# Patient Record
Sex: Female | Born: 1960 | Race: White | Hispanic: No | Marital: Married | Smoking: Never smoker
Health system: Southern US, Community
[De-identification: ages and names within clinical notes are randomized; demographics above are authoritative.]

## PROBLEM LIST (undated history)

## (undated) DIAGNOSIS — E785 Hyperlipidemia, unspecified: Secondary | ICD-10-CM

## (undated) DIAGNOSIS — I10 Essential (primary) hypertension: Secondary | ICD-10-CM

## (undated) HISTORY — DX: Essential (primary) hypertension: I10

## (undated) HISTORY — DX: Hyperlipidemia, unspecified: E78.5

---

## 1998-05-03 ENCOUNTER — Ambulatory Visit (HOSPITAL_COMMUNITY): Admission: RE | Admit: 1998-05-03 | Discharge: 1998-05-03 | Payer: Self-pay | Admitting: Family Medicine

## 2000-09-19 ENCOUNTER — Encounter: Payer: Self-pay | Admitting: Family Medicine

## 2000-09-19 ENCOUNTER — Ambulatory Visit (HOSPITAL_COMMUNITY): Admission: RE | Admit: 2000-09-19 | Discharge: 2000-09-19 | Payer: Self-pay | Admitting: Family Medicine

## 2001-10-28 ENCOUNTER — Other Ambulatory Visit: Admission: RE | Admit: 2001-10-28 | Discharge: 2001-10-28 | Payer: Self-pay | Admitting: Family Medicine

## 2003-06-17 ENCOUNTER — Encounter: Payer: Self-pay | Admitting: Family Medicine

## 2003-06-17 ENCOUNTER — Ambulatory Visit (HOSPITAL_COMMUNITY): Admission: RE | Admit: 2003-06-17 | Discharge: 2003-06-17 | Payer: Self-pay | Admitting: Family Medicine

## 2004-07-25 ENCOUNTER — Other Ambulatory Visit: Admission: RE | Admit: 2004-07-25 | Discharge: 2004-07-25 | Payer: Self-pay | Admitting: Family Medicine

## 2004-08-17 ENCOUNTER — Ambulatory Visit (HOSPITAL_COMMUNITY): Admission: RE | Admit: 2004-08-17 | Discharge: 2004-08-17 | Payer: Self-pay | Admitting: Family Medicine

## 2005-09-11 ENCOUNTER — Other Ambulatory Visit: Admission: RE | Admit: 2005-09-11 | Discharge: 2005-09-11 | Payer: Self-pay | Admitting: Family Medicine

## 2005-10-25 ENCOUNTER — Ambulatory Visit (HOSPITAL_COMMUNITY): Admission: RE | Admit: 2005-10-25 | Discharge: 2005-10-25 | Payer: Self-pay | Admitting: Family Medicine

## 2006-11-26 ENCOUNTER — Other Ambulatory Visit: Admission: RE | Admit: 2006-11-26 | Discharge: 2006-11-26 | Payer: Self-pay | Admitting: Family Medicine

## 2006-11-30 ENCOUNTER — Ambulatory Visit (HOSPITAL_COMMUNITY): Admission: RE | Admit: 2006-11-30 | Discharge: 2006-11-30 | Payer: Self-pay | Admitting: Family Medicine

## 2007-12-11 ENCOUNTER — Other Ambulatory Visit: Admission: RE | Admit: 2007-12-11 | Discharge: 2007-12-11 | Payer: Self-pay | Admitting: Family Medicine

## 2007-12-31 ENCOUNTER — Ambulatory Visit (HOSPITAL_COMMUNITY): Admission: RE | Admit: 2007-12-31 | Discharge: 2007-12-31 | Payer: Self-pay | Admitting: Family Medicine

## 2008-01-10 ENCOUNTER — Encounter: Admission: RE | Admit: 2008-01-10 | Discharge: 2008-01-10 | Payer: Self-pay | Admitting: Family Medicine

## 2008-02-06 ENCOUNTER — Encounter (INDEPENDENT_AMBULATORY_CARE_PROVIDER_SITE_OTHER): Payer: Self-pay | Admitting: Interventional Radiology

## 2008-02-06 ENCOUNTER — Encounter: Admission: RE | Admit: 2008-02-06 | Discharge: 2008-02-06 | Payer: Self-pay | Admitting: Surgery

## 2008-02-06 ENCOUNTER — Other Ambulatory Visit: Admission: RE | Admit: 2008-02-06 | Discharge: 2008-02-06 | Payer: Self-pay | Admitting: Interventional Radiology

## 2008-12-23 ENCOUNTER — Other Ambulatory Visit: Admission: RE | Admit: 2008-12-23 | Discharge: 2008-12-23 | Payer: Self-pay | Admitting: Family Medicine

## 2009-01-08 ENCOUNTER — Encounter: Admission: RE | Admit: 2009-01-08 | Discharge: 2009-01-08 | Payer: Self-pay | Admitting: Family Medicine

## 2009-01-12 ENCOUNTER — Encounter: Admission: RE | Admit: 2009-01-12 | Discharge: 2009-01-12 | Payer: Self-pay | Admitting: Family Medicine

## 2009-12-27 ENCOUNTER — Other Ambulatory Visit: Admission: RE | Admit: 2009-12-27 | Discharge: 2009-12-27 | Payer: Self-pay | Admitting: Family Medicine

## 2010-02-16 ENCOUNTER — Encounter: Admission: RE | Admit: 2010-02-16 | Discharge: 2010-02-16 | Payer: Self-pay | Admitting: Family Medicine

## 2010-02-22 ENCOUNTER — Encounter: Admission: RE | Admit: 2010-02-22 | Discharge: 2010-02-22 | Payer: Self-pay | Admitting: Family Medicine

## 2010-09-25 ENCOUNTER — Encounter: Payer: Self-pay | Admitting: Family Medicine

## 2010-09-26 ENCOUNTER — Encounter: Payer: Self-pay | Admitting: Family Medicine

## 2011-02-07 ENCOUNTER — Encounter (INDEPENDENT_AMBULATORY_CARE_PROVIDER_SITE_OTHER): Payer: Self-pay | Admitting: Surgery

## 2011-02-23 ENCOUNTER — Other Ambulatory Visit: Payer: Self-pay | Admitting: Obstetrics and Gynecology

## 2011-02-23 DIAGNOSIS — Z1231 Encounter for screening mammogram for malignant neoplasm of breast: Secondary | ICD-10-CM

## 2011-03-03 ENCOUNTER — Other Ambulatory Visit (INDEPENDENT_AMBULATORY_CARE_PROVIDER_SITE_OTHER): Payer: Self-pay | Admitting: Surgery

## 2011-03-07 ENCOUNTER — Ambulatory Visit
Admission: RE | Admit: 2011-03-07 | Discharge: 2011-03-07 | Disposition: A | Discharge status: Home / Self Care | Payer: BC Managed Care – PPO | Source: Ambulatory Visit | Attending: Obstetrics and Gynecology | Admitting: Obstetrics and Gynecology

## 2011-03-07 DIAGNOSIS — Z1231 Encounter for screening mammogram for malignant neoplasm of breast: Secondary | ICD-10-CM

## 2011-03-21 ENCOUNTER — Telehealth (INDEPENDENT_AMBULATORY_CARE_PROVIDER_SITE_OTHER): Payer: Self-pay

## 2011-04-04 ENCOUNTER — Telehealth (INDEPENDENT_AMBULATORY_CARE_PROVIDER_SITE_OTHER): Payer: Self-pay | Admitting: General Surgery

## 2011-04-05 NOTE — Telephone Encounter (Signed)
TSH level is good.  Stay on same dosage of thyroid medication. TMG

## 2011-07-21 ENCOUNTER — Other Ambulatory Visit: Payer: Self-pay | Admitting: Obstetrics and Gynecology

## 2011-07-21 HISTORY — PX: ENDOMETRIAL ABLATION: SHX621

## 2011-10-19 ENCOUNTER — Encounter (INDEPENDENT_AMBULATORY_CARE_PROVIDER_SITE_OTHER): Payer: Self-pay | Admitting: Surgery

## 2011-10-23 ENCOUNTER — Telehealth (INDEPENDENT_AMBULATORY_CARE_PROVIDER_SITE_OTHER): Payer: Self-pay | Admitting: Surgery

## 2011-11-28 ENCOUNTER — Telehealth (INDEPENDENT_AMBULATORY_CARE_PROVIDER_SITE_OTHER): Payer: Self-pay

## 2011-11-28 NOTE — Telephone Encounter (Signed)
RX faxed levothyroxine 50 mcg x12 #30

## 2011-12-11 ENCOUNTER — Ambulatory Visit (INDEPENDENT_AMBULATORY_CARE_PROVIDER_SITE_OTHER): Payer: BC Managed Care – PPO | Admitting: Surgery

## 2011-12-11 ENCOUNTER — Encounter (INDEPENDENT_AMBULATORY_CARE_PROVIDER_SITE_OTHER): Payer: Self-pay | Admitting: Surgery

## 2011-12-11 VITALS — Ht 65.0 in | Wt 137.5 lb

## 2011-12-11 DIAGNOSIS — E041 Nontoxic single thyroid nodule: Secondary | ICD-10-CM

## 2011-12-11 MED ORDER — LEVOTHYROXINE SODIUM 50 MCG PO TABS: 50.0000 ug | ORAL_TABLET | Freq: Every day | ORAL | Status: DC

## 2011-12-11 NOTE — Progress Notes (Signed)
Visit Diagnoses: 1. Thyroid nodule, uninodular    HISTORY: The patient is a 51 year old white female followed for thyroid nodule and hypothyroidism. Patient has been on a consistent dose of levothyroxine 50 mcg daily over the past few years. Recent TSH level in February 2013 is normal at 1.56. Patient did not have a thyroid ultrasound this year. Her prior ultrasound showed no nodules greater than 4 mm in size.  PERTINENT REVIEW OF SYSTEMS: No new nodules or masses. No dysphagia. No tremors. No palpitations.  EXAM: HEENT: normocephalic; pupils equal and reactive; sclerae clear; dentition good; mucous membranes moist NECK:  symmetric on extension; no palpable anterior or posterior cervical lymphadenopathy; no supraclavicular masses; no tenderness CHEST: clear to auscultation bilaterally without rales, rhonchi, or wheezes CARDIAC: regular rate and rhythm without significant murmur; peripheral pulses are full EXT:  non-tender without edema; no deformity NEURO: no gross focal deficits; no sign of tremor   IMPRESSION: #1 history of right thyroid nodule, interval decrease in size while on thyroid hormone suppression #2 hypothyroidism  PLAN: The patient and I discussed the above findings at length. Physical examination shows no significant thyroid nodules. TSH level has slowly risen from 0.7-1.2-1.56 currently. She has been on levothyroxine 50 mcg daily during this rise. I believe she will slowly become progressively more hypothyroid over the coming years. Her TSH needs to be monitored annually.  Patient will return in one year for laboratory studies and thyroid examination.  Velora Heckler, MD, FACS General & Endocrine Surgery [facility]

## 2011-12-11 NOTE — Patient Instructions (Signed)

## 2012-03-21 ENCOUNTER — Encounter (INDEPENDENT_AMBULATORY_CARE_PROVIDER_SITE_OTHER): Payer: Self-pay

## 2012-03-26 ENCOUNTER — Other Ambulatory Visit: Payer: Self-pay | Admitting: Obstetrics and Gynecology

## 2012-03-29 ENCOUNTER — Other Ambulatory Visit: Payer: Self-pay | Admitting: Obstetrics and Gynecology

## 2012-03-29 DIAGNOSIS — R928 Other abnormal and inconclusive findings on diagnostic imaging of breast: Secondary | ICD-10-CM

## 2012-04-04 ENCOUNTER — Ambulatory Visit
Admission: RE | Admit: 2012-04-04 | Discharge: 2012-04-04 | Disposition: A | Discharge status: Home / Self Care | Payer: BC Managed Care – PPO | Source: Ambulatory Visit | Attending: Obstetrics and Gynecology | Admitting: Obstetrics and Gynecology

## 2012-04-04 DIAGNOSIS — R928 Other abnormal and inconclusive findings on diagnostic imaging of breast: Secondary | ICD-10-CM

## 2012-04-30 ENCOUNTER — Other Ambulatory Visit: Payer: Self-pay | Admitting: Obstetrics and Gynecology

## 2012-11-07 ENCOUNTER — Encounter (INDEPENDENT_AMBULATORY_CARE_PROVIDER_SITE_OTHER): Payer: Self-pay | Admitting: General Surgery

## 2012-11-07 ENCOUNTER — Telehealth (INDEPENDENT_AMBULATORY_CARE_PROVIDER_SITE_OTHER): Payer: Self-pay | Admitting: General Surgery

## 2012-11-07 NOTE — Telephone Encounter (Signed)
Left message for patient to make her April f/u appt with  Dr Gerrit Friends as well as Labs . I mailed letter and lab form to patient .

## 2012-12-23 ENCOUNTER — Encounter (INDEPENDENT_AMBULATORY_CARE_PROVIDER_SITE_OTHER): Payer: Self-pay | Admitting: Surgery

## 2012-12-25 ENCOUNTER — Telehealth (INDEPENDENT_AMBULATORY_CARE_PROVIDER_SITE_OTHER): Payer: Self-pay | Admitting: *Deleted

## 2012-12-25 NOTE — Telephone Encounter (Signed)
Patient called to request a refill of her Synthroid.  Patient states she thought she was suppose to wait until a week prior to her f/u appt to have her lab drawn which is 6/2 so at this time patient has not had them drawn yet however she will go to have them drawn this week.  Patient states she has the lab slip and will go to [facility].  This Rn will call in Synthroid 1 tablet daily #30 no refills to get the patient through until we can get the TSH results back.  Patient is agreeable and states understanding at this time.  Prescription called into [facility] [phone].

## 2013-01-13 ENCOUNTER — Telehealth (INDEPENDENT_AMBULATORY_CARE_PROVIDER_SITE_OTHER): Payer: Self-pay

## 2013-01-13 NOTE — Telephone Encounter (Signed)
TSH 2.050 here and to Dr Gerrit Friends for review and any action.

## 2013-01-15 ENCOUNTER — Telehealth (INDEPENDENT_AMBULATORY_CARE_PROVIDER_SITE_OTHER): Payer: Self-pay

## 2013-01-15 NOTE — Telephone Encounter (Signed)
Message copied by Joanette Gula on Wed Jan 15, 2013  9:42 AM ------      Message from: Velora Heckler      Created: Wed Jan 15, 2013  8:59 AM       [name]:            Patient's TSH is good on her current dose of Synthroid 50 mcg daily.  She was supposed to come see me about now for exam.  Otherwise, she can be followed and managed by her primary MD.  Let me know.            tmg ------

## 2013-01-16 ENCOUNTER — Telehealth (INDEPENDENT_AMBULATORY_CARE_PROVIDER_SITE_OTHER): Payer: Self-pay | Admitting: General Surgery

## 2013-01-16 NOTE — Telephone Encounter (Signed)
Spoke with patient to let her know her TSH is in good range and to stay on 50 mcg synthroid  Daily she is aware of appt 02/03/13 405

## 2013-01-20 ENCOUNTER — Encounter (INDEPENDENT_AMBULATORY_CARE_PROVIDER_SITE_OTHER): Payer: Self-pay | Admitting: Surgery

## 2013-01-29 ENCOUNTER — Telehealth (INDEPENDENT_AMBULATORY_CARE_PROVIDER_SITE_OTHER): Payer: Self-pay | Admitting: *Deleted

## 2013-01-29 NOTE — Telephone Encounter (Signed)
Patient called to state that she will run out of her Synthroid prior to coming for her appt on 02/03/13.  Synthroid 1 tablet daily #30 no refills called into [facility] [phone]

## 2013-02-03 ENCOUNTER — Ambulatory Visit (INDEPENDENT_AMBULATORY_CARE_PROVIDER_SITE_OTHER): Payer: BC Managed Care – PPO | Admitting: Surgery

## 2013-02-03 ENCOUNTER — Encounter (INDEPENDENT_AMBULATORY_CARE_PROVIDER_SITE_OTHER): Payer: Self-pay | Admitting: Surgery

## 2013-02-03 VITALS — BP 120/68 | HR 74 | Temp 98.6°F | Resp 18 | Ht 65.0 in | Wt 143.0 lb

## 2013-02-03 DIAGNOSIS — E041 Nontoxic single thyroid nodule: Secondary | ICD-10-CM

## 2013-02-03 MED ORDER — LEVOTHYROXINE SODIUM 50 MCG PO TABS
50.0000 ug | ORAL_TABLET | Freq: Every day | ORAL | Status: DC
Start: 2013-02-03 — End: 2014-02-12

## 2013-02-03 MED ORDER — LEVOTHYROXINE SODIUM 50 MCG PO TABS: 50.0000 ug | ORAL_TABLET | Freq: Every day | ORAL | Status: AC

## 2013-02-03 NOTE — Patient Instructions (Signed)

## 2013-02-03 NOTE — Progress Notes (Signed)
[facility]  Visit Diagnoses: 1. Thyroid nodule, uninodular     HISTORY: Patient is a 52 year old white female followed in my practice for bilateral thyroid nodules. She is on low dose thyroid hormone supplementation with levothyroxine 50 mcg daily. Her most recent TSH level is normal at 2.05. She did not require ultrasound examination this year.  PERTINENT REVIEW OF SYSTEMS: Denies new nodules. Denies tremor. Denies palpitations. No compressive symptoms.  EXAM: HEENT: normocephalic; pupils equal and reactive; sclerae clear; dentition good; mucous membranes moist NECK:  No palpable nodules in the thyroid bed; symmetric on extension; no palpable anterior or posterior cervical lymphadenopathy; no supraclavicular masses; no tenderness CHEST: clear to auscultation bilaterally without rales, rhonchi, or wheezes CARDIAC: regular rate and rhythm without significant murmur; peripheral pulses are full EXT:  non-tender without edema; no deformity NEURO: no gross focal deficits; no sign of tremor   IMPRESSION: #1  Subcentimeter thyroid nodules, clinically stable #2  Hypothyroidism  PLAN: The patient and I discussed her physical exam, her past ultrasound studies, and her TSH level. I provided her with written literature on TSH level to review.  I have reviewed her levothyroxin 50 mcg for the next year.  Patient will be followed in the future by her primary care physician. She does not require surgical intervention. She should have a TSH level checked once a year. My goal with therapy would be to keep her TSH level between 1.0 and 2.0.  Patient will return for surgical care as needed.  Velora Heckler, MD, [facility] Office: [phone]

## 2013-04-01 ENCOUNTER — Other Ambulatory Visit: Payer: Self-pay | Admitting: Obstetrics and Gynecology

## 2013-04-10 ENCOUNTER — Other Ambulatory Visit: Payer: Self-pay

## 2013-04-10 DIAGNOSIS — Z1231 Encounter for screening mammogram for malignant neoplasm of breast: Secondary | ICD-10-CM

## 2013-05-01 ENCOUNTER — Ambulatory Visit
Admission: RE | Admit: 2013-05-01 | Discharge: 2013-05-01 | Disposition: A | Discharge status: Home / Self Care | Payer: BC Managed Care – PPO | Source: Ambulatory Visit

## 2013-05-01 DIAGNOSIS — Z1231 Encounter for screening mammogram for malignant neoplasm of breast: Secondary | ICD-10-CM

## 2013-05-26 ENCOUNTER — Other Ambulatory Visit: Payer: Self-pay | Admitting: Gastroenterology

## 2014-02-12 ENCOUNTER — Other Ambulatory Visit (INDEPENDENT_AMBULATORY_CARE_PROVIDER_SITE_OTHER): Payer: Self-pay

## 2014-02-12 DIAGNOSIS — E039 Hypothyroidism, unspecified: Secondary | ICD-10-CM

## 2014-02-12 MED ORDER — LEVOTHYROXINE SODIUM 50 MCG PO TABS: 50.0000 ug | ORAL_TABLET | Freq: Every day | ORAL | Status: AC

## 2014-04-29 ENCOUNTER — Other Ambulatory Visit: Payer: Self-pay | Admitting: Obstetrics & Gynecology

## 2014-04-30 ENCOUNTER — Other Ambulatory Visit: Payer: Self-pay

## 2014-04-30 DIAGNOSIS — Z1231 Encounter for screening mammogram for malignant neoplasm of breast: Secondary | ICD-10-CM

## 2014-04-30 LAB — CYTOLOGY - PAP

## 2014-05-04 ENCOUNTER — Ambulatory Visit
Admission: RE | Admit: 2014-05-04 | Discharge: 2014-05-04 | Disposition: A | Discharge status: Home / Self Care | Payer: BC Managed Care – PPO | Source: Ambulatory Visit

## 2014-05-04 DIAGNOSIS — Z1231 Encounter for screening mammogram for malignant neoplasm of breast: Secondary | ICD-10-CM

## 2015-04-06 ENCOUNTER — Other Ambulatory Visit: Payer: Self-pay

## 2015-04-06 DIAGNOSIS — Z1231 Encounter for screening mammogram for malignant neoplasm of breast: Secondary | ICD-10-CM

## 2015-05-14 ENCOUNTER — Ambulatory Visit
Admission: RE | Admit: 2015-05-14 | Discharge: 2015-05-14 | Disposition: A | Discharge status: Home / Self Care | Payer: 59 | Source: Ambulatory Visit

## 2015-05-14 DIAGNOSIS — Z1231 Encounter for screening mammogram for malignant neoplasm of breast: Secondary | ICD-10-CM

## 2015-05-17 ENCOUNTER — Other Ambulatory Visit: Payer: Self-pay | Admitting: Obstetrics and Gynecology

## 2015-05-18 LAB — CYTOLOGY - PAP

## 2016-04-10 ENCOUNTER — Other Ambulatory Visit: Payer: Self-pay | Admitting: Family Medicine

## 2016-04-10 DIAGNOSIS — Z1231 Encounter for screening mammogram for malignant neoplasm of breast: Secondary | ICD-10-CM

## 2016-05-15 ENCOUNTER — Ambulatory Visit: Payer: 59

## 2016-05-16 ENCOUNTER — Ambulatory Visit
Admission: RE | Admit: 2016-05-16 | Discharge: 2016-05-16 | Disposition: A | Discharge status: Home / Self Care | Payer: 59 | Source: Ambulatory Visit | Attending: Family Medicine | Admitting: Family Medicine

## 2016-05-16 DIAGNOSIS — Z1231 Encounter for screening mammogram for malignant neoplasm of breast: Secondary | ICD-10-CM

## 2016-05-24 ENCOUNTER — Other Ambulatory Visit: Payer: Self-pay | Admitting: Family Medicine

## 2016-05-24 DIAGNOSIS — R928 Other abnormal and inconclusive findings on diagnostic imaging of breast: Secondary | ICD-10-CM

## 2016-05-25 ENCOUNTER — Ambulatory Visit
Admission: RE | Admit: 2016-05-25 | Discharge: 2016-05-25 | Disposition: A | Discharge status: Home / Self Care | Payer: 59 | Source: Ambulatory Visit | Attending: Family Medicine | Admitting: Family Medicine

## 2016-05-25 DIAGNOSIS — R928 Other abnormal and inconclusive findings on diagnostic imaging of breast: Secondary | ICD-10-CM

## 2016-05-29 ENCOUNTER — Other Ambulatory Visit: Payer: 59

## 2016-11-07 DIAGNOSIS — E039 Hypothyroidism, unspecified: Secondary | ICD-10-CM | POA: Diagnosis not present

## 2016-11-07 DIAGNOSIS — G4700 Insomnia, unspecified: Secondary | ICD-10-CM | POA: Diagnosis not present

## 2017-02-04 DIAGNOSIS — S61216A Laceration without foreign body of right little finger without damage to nail, initial encounter: Secondary | ICD-10-CM | POA: Diagnosis not present

## 2017-02-04 DIAGNOSIS — S61210A Laceration without foreign body of right index finger without damage to nail, initial encounter: Secondary | ICD-10-CM | POA: Diagnosis not present

## 2017-03-20 DIAGNOSIS — J069 Acute upper respiratory infection, unspecified: Secondary | ICD-10-CM | POA: Diagnosis not present

## 2017-05-02 ENCOUNTER — Other Ambulatory Visit: Payer: Self-pay | Admitting: Family Medicine

## 2017-05-02 DIAGNOSIS — Z1231 Encounter for screening mammogram for malignant neoplasm of breast: Secondary | ICD-10-CM

## 2017-05-18 ENCOUNTER — Inpatient Hospital Stay: Admission: RE | Admit: 2017-05-18 | Payer: 59 | Source: Ambulatory Visit

## 2017-05-18 ENCOUNTER — Ambulatory Visit: Payer: 59

## 2017-05-18 ENCOUNTER — Ambulatory Visit
Admission: RE | Admit: 2017-05-18 | Discharge: 2017-05-18 | Disposition: A | Discharge status: Home / Self Care | Payer: 59 | Source: Ambulatory Visit | Attending: Family Medicine | Admitting: Family Medicine

## 2017-05-18 DIAGNOSIS — Z1231 Encounter for screening mammogram for malignant neoplasm of breast: Secondary | ICD-10-CM | POA: Diagnosis not present

## 2017-05-21 DIAGNOSIS — Z01419 Encounter for gynecological examination (general) (routine) without abnormal findings: Secondary | ICD-10-CM | POA: Diagnosis not present

## 2017-06-13 DIAGNOSIS — E039 Hypothyroidism, unspecified: Secondary | ICD-10-CM | POA: Diagnosis not present

## 2017-06-13 DIAGNOSIS — E7800 Pure hypercholesterolemia, unspecified: Secondary | ICD-10-CM | POA: Diagnosis not present

## 2017-06-13 DIAGNOSIS — G4700 Insomnia, unspecified: Secondary | ICD-10-CM | POA: Diagnosis not present

## 2017-06-13 DIAGNOSIS — Z0000 Encounter for general adult medical examination without abnormal findings: Secondary | ICD-10-CM | POA: Diagnosis not present

## 2017-06-13 DIAGNOSIS — Z1159 Encounter for screening for other viral diseases: Secondary | ICD-10-CM | POA: Diagnosis not present

## 2017-12-03 DIAGNOSIS — R05 Cough: Secondary | ICD-10-CM | POA: Diagnosis not present

## 2017-12-03 DIAGNOSIS — J018 Other acute sinusitis: Secondary | ICD-10-CM | POA: Diagnosis not present

## 2017-12-11 DIAGNOSIS — G4700 Insomnia, unspecified: Secondary | ICD-10-CM | POA: Diagnosis not present

## 2017-12-11 DIAGNOSIS — E039 Hypothyroidism, unspecified: Secondary | ICD-10-CM | POA: Diagnosis not present

## 2018-04-09 ENCOUNTER — Other Ambulatory Visit: Payer: Self-pay | Admitting: Family Medicine

## 2018-04-09 DIAGNOSIS — Z1231 Encounter for screening mammogram for malignant neoplasm of breast: Secondary | ICD-10-CM

## 2018-05-20 ENCOUNTER — Ambulatory Visit
Admission: RE | Admit: 2018-05-20 | Discharge: 2018-05-20 | Disposition: A | Discharge status: Home / Self Care | Payer: 59 | Source: Ambulatory Visit | Attending: Family Medicine | Admitting: Family Medicine

## 2018-05-20 DIAGNOSIS — Z1231 Encounter for screening mammogram for malignant neoplasm of breast: Secondary | ICD-10-CM

## 2018-05-22 DIAGNOSIS — Z01419 Encounter for gynecological examination (general) (routine) without abnormal findings: Secondary | ICD-10-CM | POA: Diagnosis not present

## 2018-05-22 DIAGNOSIS — Z6826 Body mass index (BMI) 26.0-26.9, adult: Secondary | ICD-10-CM | POA: Diagnosis not present

## 2018-06-17 DIAGNOSIS — E7800 Pure hypercholesterolemia, unspecified: Secondary | ICD-10-CM | POA: Diagnosis not present

## 2018-06-17 DIAGNOSIS — G4700 Insomnia, unspecified: Secondary | ICD-10-CM | POA: Diagnosis not present

## 2018-06-17 DIAGNOSIS — Z0000 Encounter for general adult medical examination without abnormal findings: Secondary | ICD-10-CM | POA: Diagnosis not present

## 2018-06-17 DIAGNOSIS — E039 Hypothyroidism, unspecified: Secondary | ICD-10-CM | POA: Diagnosis not present

## 2018-08-12 DIAGNOSIS — K648 Other hemorrhoids: Secondary | ICD-10-CM | POA: Diagnosis not present

## 2018-08-12 DIAGNOSIS — Z86010 Personal history of colonic polyps: Secondary | ICD-10-CM | POA: Diagnosis not present

## 2018-12-17 DIAGNOSIS — E039 Hypothyroidism, unspecified: Secondary | ICD-10-CM | POA: Diagnosis not present

## 2018-12-17 DIAGNOSIS — G4700 Insomnia, unspecified: Secondary | ICD-10-CM | POA: Diagnosis not present

## 2019-03-17 ENCOUNTER — Other Ambulatory Visit: Payer: Self-pay | Admitting: Family Medicine

## 2019-03-17 DIAGNOSIS — Z1231 Encounter for screening mammogram for malignant neoplasm of breast: Secondary | ICD-10-CM

## 2019-05-23 ENCOUNTER — Other Ambulatory Visit: Payer: Self-pay

## 2019-05-23 ENCOUNTER — Ambulatory Visit
Admission: RE | Admit: 2019-05-23 | Discharge: 2019-05-23 | Disposition: A | Discharge status: Home / Self Care | Payer: 59 | Source: Ambulatory Visit | Attending: Family Medicine | Admitting: Family Medicine

## 2019-05-23 DIAGNOSIS — Z1231 Encounter for screening mammogram for malignant neoplasm of breast: Secondary | ICD-10-CM

## 2019-07-09 ENCOUNTER — Other Ambulatory Visit: Payer: Self-pay

## 2019-07-09 DIAGNOSIS — Z20822 Contact with and (suspected) exposure to covid-19: Secondary | ICD-10-CM

## 2019-07-10 LAB — NOVEL CORONAVIRUS, NAA: SARS-CoV-2, NAA: NOT DETECTED

## 2019-07-16 DIAGNOSIS — E039 Hypothyroidism, unspecified: Secondary | ICD-10-CM | POA: Diagnosis not present

## 2019-07-16 DIAGNOSIS — E7800 Pure hypercholesterolemia, unspecified: Secondary | ICD-10-CM | POA: Diagnosis not present

## 2019-09-11 ENCOUNTER — Ambulatory Visit: Payer: BC Managed Care – PPO | Attending: Internal Medicine

## 2019-09-11 DIAGNOSIS — Z20822 Contact with and (suspected) exposure to covid-19: Secondary | ICD-10-CM

## 2019-09-13 LAB — NOVEL CORONAVIRUS, NAA: SARS-CoV-2, NAA: NOT DETECTED

## 2019-11-15 ENCOUNTER — Ambulatory Visit: Payer: BC Managed Care – PPO | Attending: Internal Medicine

## 2019-11-15 DIAGNOSIS — Z23 Encounter for immunization: Secondary | ICD-10-CM

## 2019-11-15 NOTE — Progress Notes (Signed)
   Covid-19 Vaccination Clinic  Name:  Rebekah Solis    MRN: [id number] DOB: [birth year]  11/15/2019  [name] was observed post Covid-19 immunization for 15 minutes without incident. She was provided with Vaccine Information Sheet and instruction to access the V-Safe system.   [name] was instructed to call 911 with any severe reactions post vaccine: Marland Kitchen Difficulty breathing  . Swelling of face and throat  . A fast heartbeat  . A bad rash all over body  . Dizziness and weakness   Immunizations Administered    Name Date Dose VIS Date Route   Pfizer COVID-19 Vaccine 11/15/2019 12:18 PM 0.3 mL 08/15/2019 Intramuscular   Manufacturer: [employer], [employer]   Lot: [id number]   NDC: [address]-0

## 2019-12-08 ENCOUNTER — Ambulatory Visit: Payer: BC Managed Care – PPO | Attending: Internal Medicine

## 2019-12-08 ENCOUNTER — Ambulatory Visit: Payer: BC Managed Care – PPO

## 2019-12-08 DIAGNOSIS — Z23 Encounter for immunization: Secondary | ICD-10-CM

## 2019-12-08 NOTE — Progress Notes (Signed)
   Covid-19 Vaccination Clinic  Name:  SAMIRAH SCARPATI    MRN: [id number] DOB: [birth year]  12/08/2019  Ms. Shutt was observed post Covid-19 immunization for 15 minutes without incident. She was provided with Vaccine Information Sheet and instruction to access the V-Safe system.   Ms. Gal was instructed to call 911 with any severe reactions post vaccine: Marland Kitchen Difficulty breathing  . Swelling of face and throat  . A fast heartbeat  . A bad rash all over body  . Dizziness and weakness   Immunizations Administered    Name Date Dose VIS Date Route   Pfizer COVID-19 Vaccine 12/08/2019  2:58 PM 0.3 mL 08/15/2019 Intramuscular   Manufacturer: [employer], [employer]   Lot: [id number]   NDC: [address]-4

## 2020-01-15 DIAGNOSIS — F419 Anxiety disorder, unspecified: Secondary | ICD-10-CM | POA: Diagnosis not present

## 2020-01-15 DIAGNOSIS — E039 Hypothyroidism, unspecified: Secondary | ICD-10-CM | POA: Diagnosis not present

## 2020-01-15 DIAGNOSIS — G4700 Insomnia, unspecified: Secondary | ICD-10-CM | POA: Diagnosis not present

## 2020-03-19 DIAGNOSIS — B029 Zoster without complications: Secondary | ICD-10-CM | POA: Diagnosis not present

## 2020-03-19 DIAGNOSIS — D485 Neoplasm of uncertain behavior of skin: Secondary | ICD-10-CM | POA: Diagnosis not present

## 2020-03-19 DIAGNOSIS — L281 Prurigo nodularis: Secondary | ICD-10-CM | POA: Diagnosis not present

## 2020-04-12 DIAGNOSIS — J22 Unspecified acute lower respiratory infection: Secondary | ICD-10-CM | POA: Diagnosis not present

## 2020-04-12 DIAGNOSIS — J309 Allergic rhinitis, unspecified: Secondary | ICD-10-CM | POA: Diagnosis not present

## 2020-04-12 DIAGNOSIS — J205 Acute bronchitis due to respiratory syncytial virus: Secondary | ICD-10-CM | POA: Diagnosis not present

## 2020-04-12 DIAGNOSIS — Z03818 Encounter for observation for suspected exposure to other biological agents ruled out: Secondary | ICD-10-CM | POA: Diagnosis not present

## 2020-04-19 ENCOUNTER — Other Ambulatory Visit: Payer: Self-pay | Admitting: Family Medicine

## 2020-04-19 DIAGNOSIS — Z1231 Encounter for screening mammogram for malignant neoplasm of breast: Secondary | ICD-10-CM

## 2020-05-24 ENCOUNTER — Other Ambulatory Visit: Payer: Self-pay

## 2020-05-24 ENCOUNTER — Ambulatory Visit
Admission: RE | Admit: 2020-05-24 | Discharge: 2020-05-24 | Disposition: A | Discharge status: Home / Self Care | Payer: BC Managed Care – PPO | Source: Ambulatory Visit | Attending: Family Medicine | Admitting: Family Medicine

## 2020-05-24 DIAGNOSIS — Z1231 Encounter for screening mammogram for malignant neoplasm of breast: Secondary | ICD-10-CM | POA: Diagnosis not present

## 2020-06-23 DIAGNOSIS — Z01419 Encounter for gynecological examination (general) (routine) without abnormal findings: Secondary | ICD-10-CM | POA: Diagnosis not present

## 2020-06-23 DIAGNOSIS — Z6826 Body mass index (BMI) 26.0-26.9, adult: Secondary | ICD-10-CM | POA: Diagnosis not present

## 2020-07-19 DIAGNOSIS — G4700 Insomnia, unspecified: Secondary | ICD-10-CM | POA: Diagnosis not present

## 2020-07-19 DIAGNOSIS — E039 Hypothyroidism, unspecified: Secondary | ICD-10-CM | POA: Diagnosis not present

## 2020-07-19 DIAGNOSIS — Z23 Encounter for immunization: Secondary | ICD-10-CM | POA: Diagnosis not present

## 2020-07-19 DIAGNOSIS — E7800 Pure hypercholesterolemia, unspecified: Secondary | ICD-10-CM | POA: Diagnosis not present

## 2020-07-19 DIAGNOSIS — Z0000 Encounter for general adult medical examination without abnormal findings: Secondary | ICD-10-CM | POA: Diagnosis not present

## 2020-09-21 DIAGNOSIS — D2261 Melanocytic nevi of right upper limb, including shoulder: Secondary | ICD-10-CM | POA: Diagnosis not present

## 2020-09-21 DIAGNOSIS — D1801 Hemangioma of skin and subcutaneous tissue: Secondary | ICD-10-CM | POA: Diagnosis not present

## 2020-09-21 DIAGNOSIS — D2262 Melanocytic nevi of left upper limb, including shoulder: Secondary | ICD-10-CM | POA: Diagnosis not present

## 2020-09-21 DIAGNOSIS — D2239 Melanocytic nevi of other parts of face: Secondary | ICD-10-CM | POA: Diagnosis not present

## 2021-01-12 DIAGNOSIS — E039 Hypothyroidism, unspecified: Secondary | ICD-10-CM | POA: Diagnosis not present

## 2021-01-12 DIAGNOSIS — U071 COVID-19: Secondary | ICD-10-CM | POA: Diagnosis not present

## 2021-01-12 DIAGNOSIS — G4700 Insomnia, unspecified: Secondary | ICD-10-CM | POA: Diagnosis not present

## 2021-01-12 DIAGNOSIS — F419 Anxiety disorder, unspecified: Secondary | ICD-10-CM | POA: Diagnosis not present

## 2021-03-22 DIAGNOSIS — D225 Melanocytic nevi of trunk: Secondary | ICD-10-CM | POA: Diagnosis not present

## 2021-03-22 DIAGNOSIS — L918 Other hypertrophic disorders of the skin: Secondary | ICD-10-CM | POA: Diagnosis not present

## 2021-03-22 DIAGNOSIS — L820 Inflamed seborrheic keratosis: Secondary | ICD-10-CM | POA: Diagnosis not present

## 2021-03-22 DIAGNOSIS — L308 Other specified dermatitis: Secondary | ICD-10-CM | POA: Diagnosis not present

## 2021-03-22 DIAGNOSIS — B0229 Other postherpetic nervous system involvement: Secondary | ICD-10-CM | POA: Diagnosis not present

## 2021-04-12 ENCOUNTER — Other Ambulatory Visit: Payer: Self-pay | Admitting: Family Medicine

## 2021-04-12 DIAGNOSIS — Z1231 Encounter for screening mammogram for malignant neoplasm of breast: Secondary | ICD-10-CM

## 2021-06-02 ENCOUNTER — Ambulatory Visit
Admission: RE | Admit: 2021-06-02 | Discharge: 2021-06-02 | Disposition: A | Discharge status: Home / Self Care | Payer: BC Managed Care – PPO | Source: Ambulatory Visit | Attending: Family Medicine | Admitting: Family Medicine

## 2021-06-02 ENCOUNTER — Other Ambulatory Visit: Payer: Self-pay

## 2021-06-02 DIAGNOSIS — Z1231 Encounter for screening mammogram for malignant neoplasm of breast: Secondary | ICD-10-CM

## 2021-07-20 DIAGNOSIS — Z6825 Body mass index (BMI) 25.0-25.9, adult: Secondary | ICD-10-CM | POA: Diagnosis not present

## 2021-07-20 DIAGNOSIS — Z01419 Encounter for gynecological examination (general) (routine) without abnormal findings: Secondary | ICD-10-CM | POA: Diagnosis not present

## 2021-07-21 DIAGNOSIS — F419 Anxiety disorder, unspecified: Secondary | ICD-10-CM | POA: Diagnosis not present

## 2021-07-21 DIAGNOSIS — E7800 Pure hypercholesterolemia, unspecified: Secondary | ICD-10-CM | POA: Diagnosis not present

## 2021-07-21 DIAGNOSIS — G4700 Insomnia, unspecified: Secondary | ICD-10-CM | POA: Diagnosis not present

## 2021-07-21 DIAGNOSIS — E039 Hypothyroidism, unspecified: Secondary | ICD-10-CM | POA: Diagnosis not present

## 2021-07-21 DIAGNOSIS — Z0000 Encounter for general adult medical examination without abnormal findings: Secondary | ICD-10-CM | POA: Diagnosis not present

## 2021-09-06 IMAGING — MG DIGITAL SCREENING BILAT W/ TOMO W/ CAD
8 series · 8 of 24 positions shown · non-contrast
Comparison: Previous exam(s).

CLINICAL DATA: Screening.

EXAM:
DIGITAL SCREENING BILATERAL MAMMOGRAM WITH TOMO AND CAD

[R MLO synth-2D]
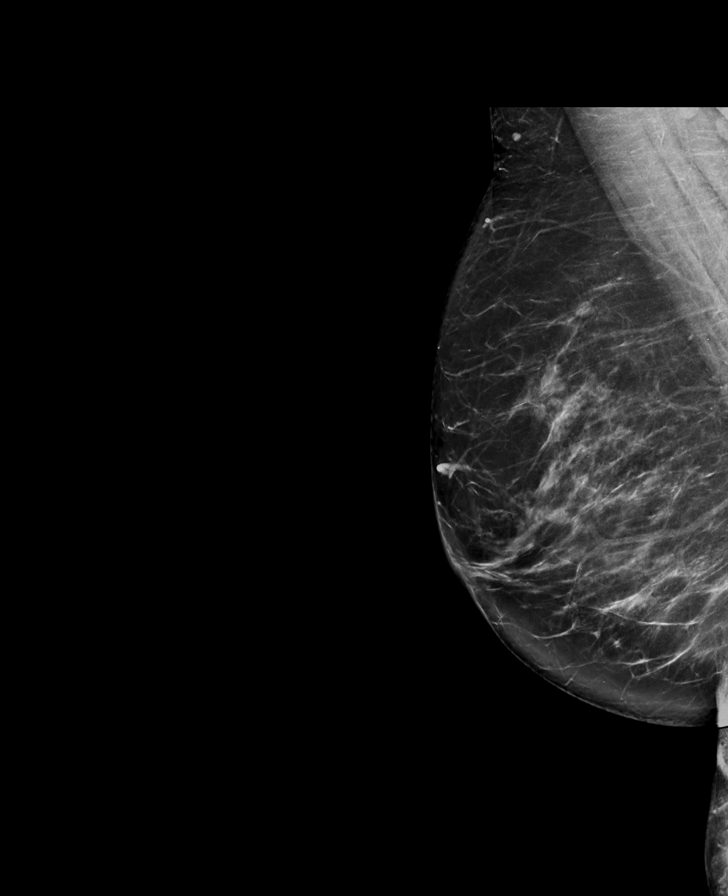

[L CC synth-2D]
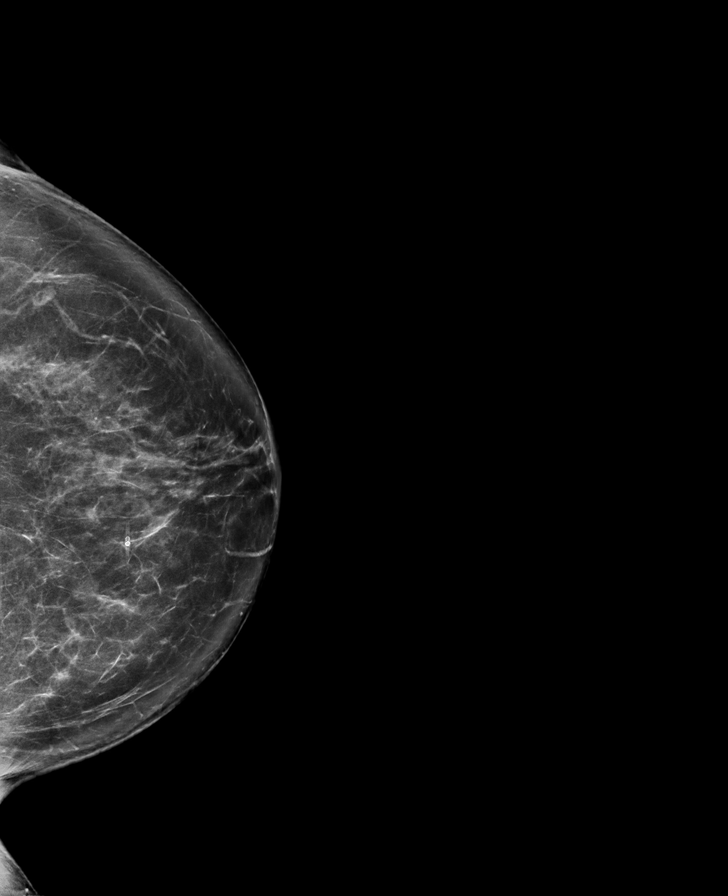

[R CC synth-2D]
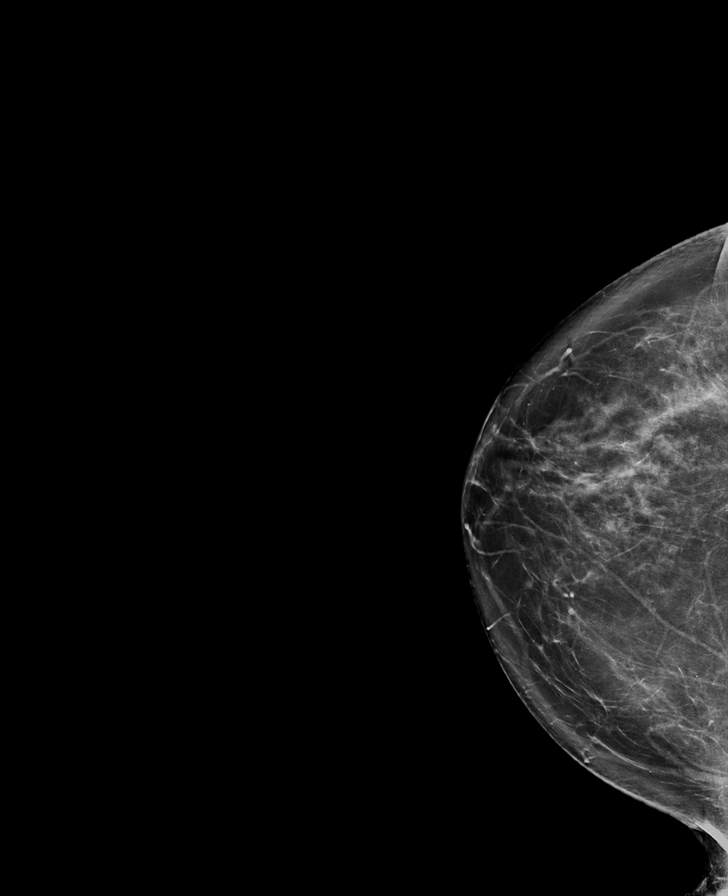

[L MLO synth-2D]
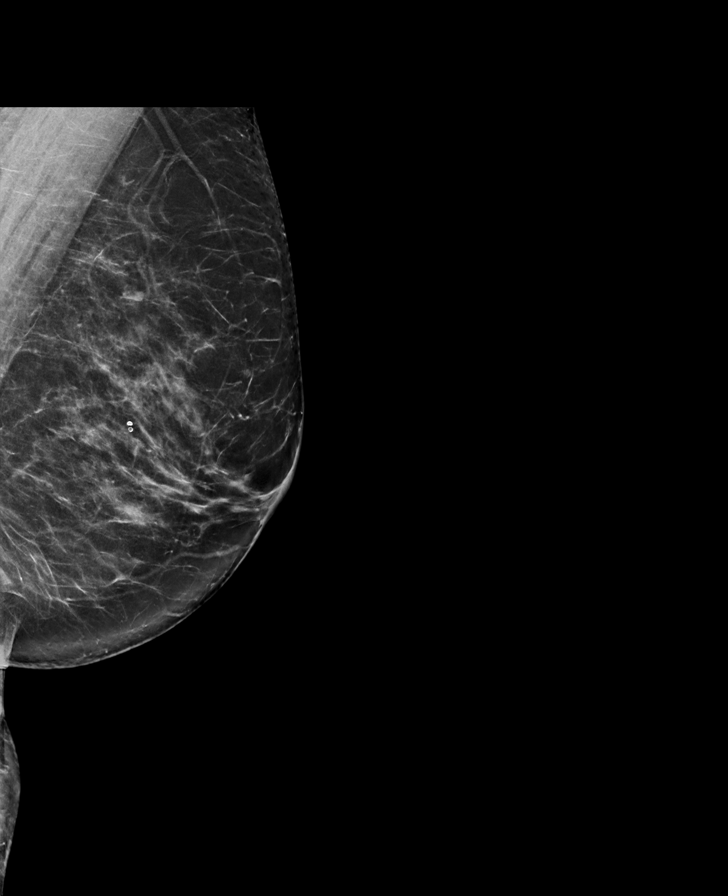

[L CC tomo · tomo slice 43/86.0]
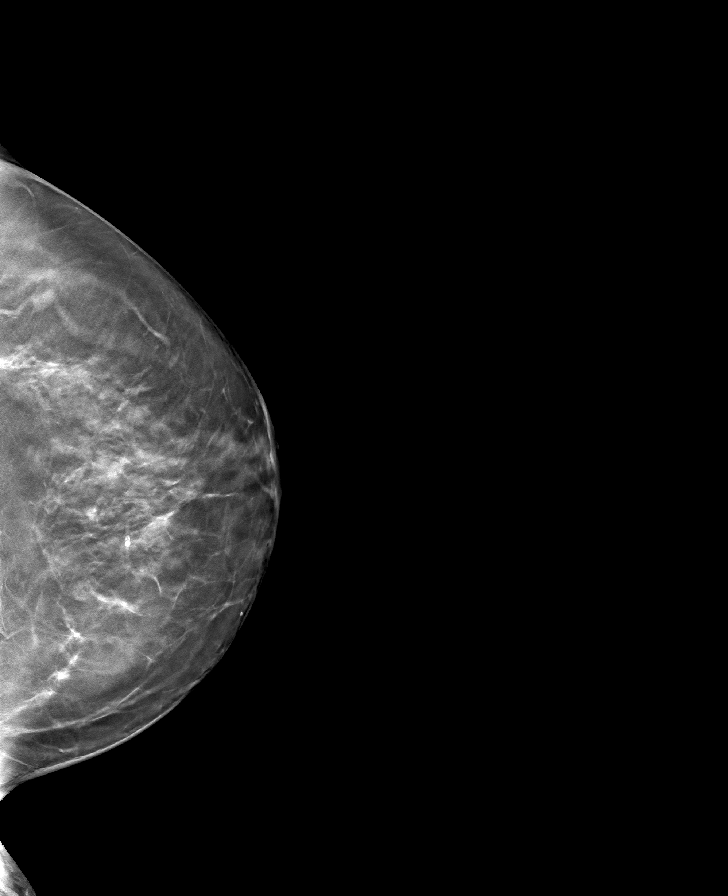

[L MLO tomo · tomo slice 41/82.0]
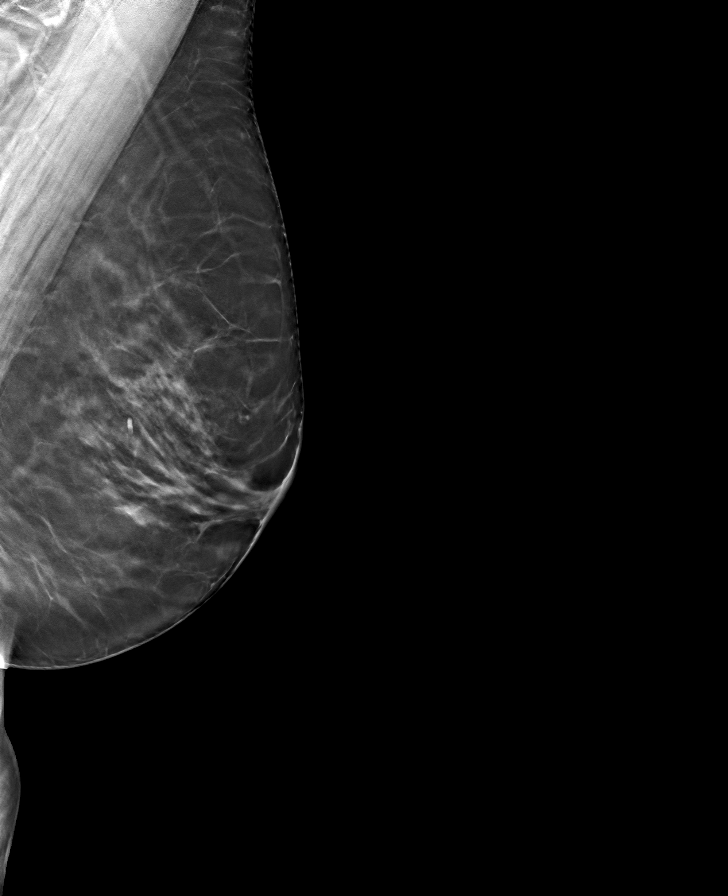

[R MLO tomo · tomo slice 43/86.0]
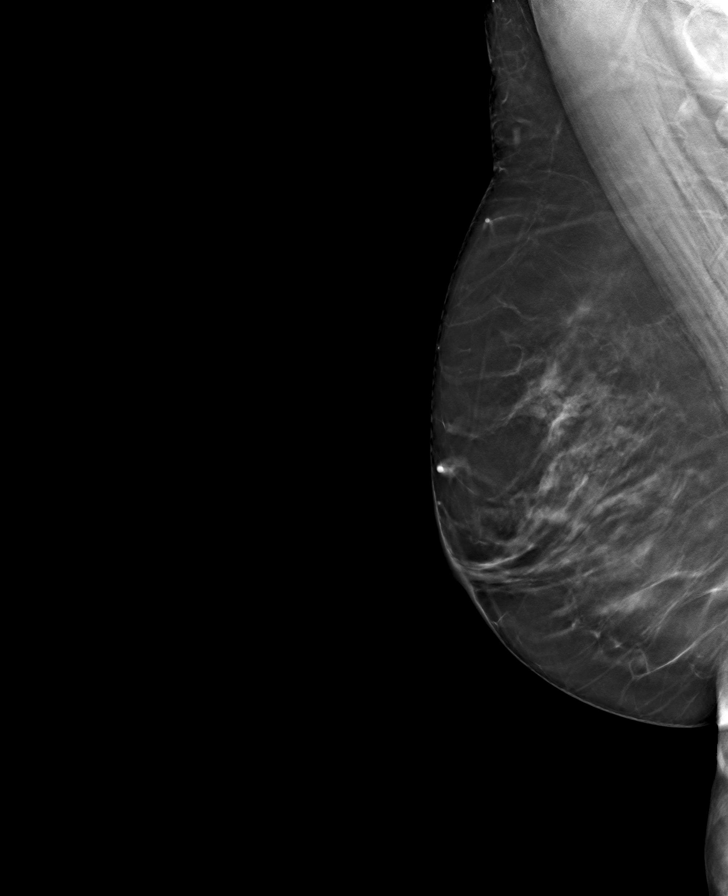

[R CC tomo · tomo slice 44/87.0]
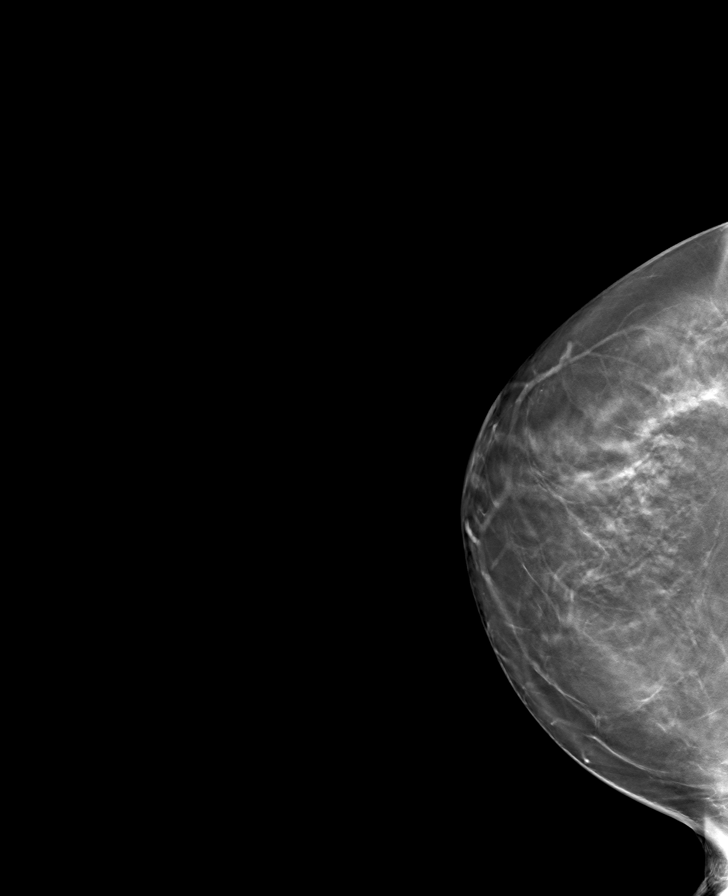

[8 of 24 positions shown; findings below may reference images not displayed]

ACR Breast Density Category c: The breast tissue is heterogeneously
dense, which may obscure small masses.
FINDINGS: There are no findings suspicious for malignancy. Images were
processed with CAD.
IMPRESSION: No mammographic evidence of malignancy. A result letter of this
screening mammogram will be mailed directly to the patient.

RECOMMENDATION:
Screening mammogram in one year. (Code:[id number])

BI-RADS CATEGORY  1: Negative.

## 2021-09-22 DIAGNOSIS — F419 Anxiety disorder, unspecified: Secondary | ICD-10-CM | POA: Diagnosis not present

## 2021-09-27 DIAGNOSIS — D2239 Melanocytic nevi of other parts of face: Secondary | ICD-10-CM | POA: Diagnosis not present

## 2021-09-27 DIAGNOSIS — L570 Actinic keratosis: Secondary | ICD-10-CM | POA: Diagnosis not present

## 2021-09-27 DIAGNOSIS — D2272 Melanocytic nevi of left lower limb, including hip: Secondary | ICD-10-CM | POA: Diagnosis not present

## 2021-09-27 DIAGNOSIS — D2261 Melanocytic nevi of right upper limb, including shoulder: Secondary | ICD-10-CM | POA: Diagnosis not present

## 2021-09-27 DIAGNOSIS — D2262 Melanocytic nevi of left upper limb, including shoulder: Secondary | ICD-10-CM | POA: Diagnosis not present

## 2022-03-30 DIAGNOSIS — E039 Hypothyroidism, unspecified: Secondary | ICD-10-CM | POA: Diagnosis not present

## 2022-03-30 DIAGNOSIS — F419 Anxiety disorder, unspecified: Secondary | ICD-10-CM | POA: Diagnosis not present

## 2022-03-30 DIAGNOSIS — E7800 Pure hypercholesterolemia, unspecified: Secondary | ICD-10-CM | POA: Diagnosis not present

## 2022-04-06 ENCOUNTER — Other Ambulatory Visit: Payer: Self-pay | Admitting: Family Medicine

## 2022-04-06 DIAGNOSIS — Z1231 Encounter for screening mammogram for malignant neoplasm of breast: Secondary | ICD-10-CM

## 2022-06-05 ENCOUNTER — Ambulatory Visit
Admission: RE | Admit: 2022-06-05 | Discharge: 2022-06-05 | Disposition: A | Discharge status: Home / Self Care | Payer: BC Managed Care – PPO | Source: Ambulatory Visit | Attending: Family Medicine | Admitting: Family Medicine

## 2022-06-05 DIAGNOSIS — Z1231 Encounter for screening mammogram for malignant neoplasm of breast: Secondary | ICD-10-CM | POA: Diagnosis not present

## 2022-06-07 ENCOUNTER — Other Ambulatory Visit: Payer: Self-pay | Admitting: Family Medicine

## 2022-06-07 DIAGNOSIS — R928 Other abnormal and inconclusive findings on diagnostic imaging of breast: Secondary | ICD-10-CM

## 2022-06-19 ENCOUNTER — Ambulatory Visit
Admission: RE | Admit: 2022-06-19 | Discharge: 2022-06-19 | Disposition: A | Discharge status: Home / Self Care | Payer: BC Managed Care – PPO | Source: Ambulatory Visit | Attending: Family Medicine | Admitting: Family Medicine

## 2022-06-19 ENCOUNTER — Ambulatory Visit: Admission: RE | Admit: 2022-06-19 | Payer: BC Managed Care – PPO | Source: Ambulatory Visit

## 2022-06-19 DIAGNOSIS — R92333 Mammographic heterogeneous density, bilateral breasts: Secondary | ICD-10-CM | POA: Diagnosis not present

## 2022-06-19 DIAGNOSIS — R928 Other abnormal and inconclusive findings on diagnostic imaging of breast: Secondary | ICD-10-CM

## 2023-05-08 ENCOUNTER — Other Ambulatory Visit: Payer: Self-pay | Admitting: Family Medicine

## 2023-05-08 DIAGNOSIS — Z0000 Encounter for general adult medical examination without abnormal findings: Secondary | ICD-10-CM

## 2023-06-07 ENCOUNTER — Ambulatory Visit
Admission: RE | Admit: 2023-06-07 | Discharge: 2023-06-07 | Disposition: A | Discharge status: Home / Self Care | Payer: 59 | Source: Ambulatory Visit | Attending: Family Medicine | Admitting: Family Medicine

## 2023-06-07 DIAGNOSIS — Z0000 Encounter for general adult medical examination without abnormal findings: Secondary | ICD-10-CM

## 2023-08-16 DIAGNOSIS — Z01419 Encounter for gynecological examination (general) (routine) without abnormal findings: Secondary | ICD-10-CM | POA: Diagnosis not present

## 2023-08-16 DIAGNOSIS — Z6826 Body mass index (BMI) 26.0-26.9, adult: Secondary | ICD-10-CM | POA: Diagnosis not present

## 2023-08-16 DIAGNOSIS — Z124 Encounter for screening for malignant neoplasm of cervix: Secondary | ICD-10-CM | POA: Diagnosis not present

## 2023-08-20 DIAGNOSIS — E039 Hypothyroidism, unspecified: Secondary | ICD-10-CM | POA: Diagnosis not present

## 2023-08-20 DIAGNOSIS — F419 Anxiety disorder, unspecified: Secondary | ICD-10-CM | POA: Diagnosis not present

## 2023-08-20 DIAGNOSIS — Z0000 Encounter for general adult medical examination without abnormal findings: Secondary | ICD-10-CM | POA: Diagnosis not present

## 2023-08-20 DIAGNOSIS — G4700 Insomnia, unspecified: Secondary | ICD-10-CM | POA: Diagnosis not present

## 2023-08-20 DIAGNOSIS — B001 Herpesviral vesicular dermatitis: Secondary | ICD-10-CM | POA: Diagnosis not present

## 2023-08-20 DIAGNOSIS — E7800 Pure hypercholesterolemia, unspecified: Secondary | ICD-10-CM | POA: Diagnosis not present

## 2023-11-05 DIAGNOSIS — S4991XA Unspecified injury of right shoulder and upper arm, initial encounter: Secondary | ICD-10-CM | POA: Diagnosis not present

## 2023-11-05 DIAGNOSIS — S6991XA Unspecified injury of right wrist, hand and finger(s), initial encounter: Secondary | ICD-10-CM | POA: Diagnosis not present

## 2023-12-18 DIAGNOSIS — M542 Cervicalgia: Secondary | ICD-10-CM | POA: Diagnosis not present

## 2023-12-18 DIAGNOSIS — M791 Myalgia, unspecified site: Secondary | ICD-10-CM | POA: Diagnosis not present

## 2023-12-18 DIAGNOSIS — M25511 Pain in right shoulder: Secondary | ICD-10-CM | POA: Diagnosis not present

## 2024-02-11 DIAGNOSIS — D122 Benign neoplasm of ascending colon: Secondary | ICD-10-CM | POA: Diagnosis not present

## 2024-02-11 DIAGNOSIS — K648 Other hemorrhoids: Secondary | ICD-10-CM | POA: Diagnosis not present

## 2024-02-11 DIAGNOSIS — Z860101 Personal history of adenomatous and serrated colon polyps: Secondary | ICD-10-CM | POA: Diagnosis not present

## 2024-02-11 DIAGNOSIS — Z09 Encounter for follow-up examination after completed treatment for conditions other than malignant neoplasm: Secondary | ICD-10-CM | POA: Diagnosis not present

## 2024-02-11 DIAGNOSIS — K5730 Diverticulosis of large intestine without perforation or abscess without bleeding: Secondary | ICD-10-CM | POA: Diagnosis not present

## 2024-02-25 DIAGNOSIS — E039 Hypothyroidism, unspecified: Secondary | ICD-10-CM | POA: Diagnosis not present

## 2024-02-25 DIAGNOSIS — F419 Anxiety disorder, unspecified: Secondary | ICD-10-CM | POA: Diagnosis not present

## 2024-02-25 DIAGNOSIS — B001 Herpesviral vesicular dermatitis: Secondary | ICD-10-CM | POA: Diagnosis not present

## 2024-02-25 DIAGNOSIS — G4700 Insomnia, unspecified: Secondary | ICD-10-CM | POA: Diagnosis not present

## 2024-05-07 ENCOUNTER — Other Ambulatory Visit: Payer: Self-pay | Admitting: Family Medicine

## 2024-05-07 DIAGNOSIS — Z1231 Encounter for screening mammogram for malignant neoplasm of breast: Secondary | ICD-10-CM

## 2024-05-27 DIAGNOSIS — E039 Hypothyroidism, unspecified: Secondary | ICD-10-CM | POA: Diagnosis not present

## 2024-05-27 DIAGNOSIS — E7800 Pure hypercholesterolemia, unspecified: Secondary | ICD-10-CM | POA: Diagnosis not present

## 2024-05-27 DIAGNOSIS — G4700 Insomnia, unspecified: Secondary | ICD-10-CM | POA: Diagnosis not present

## 2024-05-27 DIAGNOSIS — I10 Essential (primary) hypertension: Secondary | ICD-10-CM | POA: Diagnosis not present

## 2024-05-27 DIAGNOSIS — F419 Anxiety disorder, unspecified: Secondary | ICD-10-CM | POA: Diagnosis not present

## 2024-06-09 ENCOUNTER — Ambulatory Visit
Admission: RE | Admit: 2024-06-09 | Discharge: 2024-06-09 | Disposition: A | Source: Ambulatory Visit | Attending: Family Medicine | Admitting: Family Medicine

## 2024-06-09 DIAGNOSIS — Z1231 Encounter for screening mammogram for malignant neoplasm of breast: Secondary | ICD-10-CM | POA: Diagnosis not present

## 2024-06-24 DIAGNOSIS — H04123 Dry eye syndrome of bilateral lacrimal glands: Secondary | ICD-10-CM | POA: Diagnosis not present

## 2024-06-24 DIAGNOSIS — G43819 Other migraine, intractable, without status migrainosus: Secondary | ICD-10-CM | POA: Diagnosis not present

## 2024-06-24 DIAGNOSIS — H5203 Hypermetropia, bilateral: Secondary | ICD-10-CM | POA: Diagnosis not present

## 2024-06-24 DIAGNOSIS — H2513 Age-related nuclear cataract, bilateral: Secondary | ICD-10-CM | POA: Diagnosis not present

## 2024-06-24 DIAGNOSIS — H524 Presbyopia: Secondary | ICD-10-CM | POA: Diagnosis not present

## 2024-06-24 DIAGNOSIS — H10413 Chronic giant papillary conjunctivitis, bilateral: Secondary | ICD-10-CM | POA: Diagnosis not present

## 2024-06-24 DIAGNOSIS — H52221 Regular astigmatism, right eye: Secondary | ICD-10-CM | POA: Diagnosis not present

## 2024-08-19 DIAGNOSIS — Z01419 Encounter for gynecological examination (general) (routine) without abnormal findings: Secondary | ICD-10-CM | POA: Diagnosis not present

## 2024-08-19 DIAGNOSIS — Z6827 Body mass index (BMI) 27.0-27.9, adult: Secondary | ICD-10-CM | POA: Diagnosis not present

## 2024-09-26 ENCOUNTER — Other Ambulatory Visit (HOSPITAL_BASED_OUTPATIENT_CLINIC_OR_DEPARTMENT_OTHER): Payer: Self-pay | Admitting: Family Medicine

## 2024-09-26 DIAGNOSIS — Z8249 Family history of ischemic heart disease and other diseases of the circulatory system: Secondary | ICD-10-CM

## 2024-10-09 ENCOUNTER — Ambulatory Visit (HOSPITAL_BASED_OUTPATIENT_CLINIC_OR_DEPARTMENT_OTHER)
Admission: RE | Admit: 2024-10-09 | Discharge: 2024-10-09 | Disposition: A | Payer: Self-pay | Source: Ambulatory Visit | Attending: Family Medicine | Admitting: Family Medicine

## 2024-10-09 DIAGNOSIS — Z8249 Family history of ischemic heart disease and other diseases of the circulatory system: Secondary | ICD-10-CM
